# Patient Record
Sex: Male | Born: 1980 | Hispanic: Yes | Marital: Single | Smoking: Former smoker
Health system: Southern US, Community
[De-identification: ages and names within clinical notes are randomized; demographics above are authoritative.]

---

## 2012-01-31 ENCOUNTER — Ambulatory Visit (INDEPENDENT_AMBULATORY_CARE_PROVIDER_SITE_OTHER): Payer: 59 | Admitting: Emergency Medicine

## 2012-01-31 VITALS — BP 120/70 | HR 73 | Temp 98.2°F | Resp 16 | Ht 71.5 in | Wt 183.8 lb

## 2012-01-31 DIAGNOSIS — R002 Palpitations: Secondary | ICD-10-CM

## 2012-01-31 NOTE — Progress Notes (Signed)
  Subjective:    Patient ID: Casey Jacobs, male    DOB: 1980-11-20, 31 y.o.   MRN: [id number]  HPI Comments: Drank a number of red bulls last night and during the night and earlier today had episodes of tachycardia and shortness of breath never had this occur previously.  Palpitations  This is a new problem. The current episode started today. The problem occurs intermittently. The problem has been gradually improving. The symptoms are aggravated by caffeine. Associated symptoms include an irregular heartbeat and shortness of breath. Pertinent negatives include no anxiety, chest fullness, chest pain, coughing, diaphoresis, dizziness, fever, malaise/fatigue, nausea, near-syncope, numbness, syncope, vomiting or weakness. He has tried nothing for the symptoms. Risk factors include being male, smoking/tobacco exposure and stress. His past medical history is significant for anxiety. There is no history of anemia, drug use, heart disease, hyperthyroidism or a valve disorder.      Review of Systems  Constitutional: Negative.  Negative for fever, malaise/fatigue and diaphoresis.  HENT: Negative.   Eyes: Negative.   Respiratory: Positive for shortness of breath. Negative for cough.   Cardiovascular: Positive for palpitations. Negative for chest pain, syncope and near-syncope.  Gastrointestinal: Negative.  Negative for nausea and vomiting.  Genitourinary: Negative.   Musculoskeletal: Negative.   Neurological: Negative for dizziness, weakness and numbness.       Objective:   Physical Exam  Constitutional: He is oriented to person, place, and time. He appears well-developed and well-nourished.  HENT:  Head: Normocephalic and atraumatic.  Eyes: Conjunctivae are normal. Pupils are equal, round, and reactive to light.  Neck: Normal range of motion. No thyromegaly present.  Cardiovascular: Normal rate, regular rhythm and normal heart sounds.   Pulmonary/Chest: Effort normal and breath sounds normal.    Abdominal: Soft.  Musculoskeletal: Normal range of motion.  Neurological: He is alert and oriented to person, place, and time.  Skin: Skin is warm and dry.          Assessment & Plan:  Caffeine overdose Palpitations EKG significant for >1 mm ST elevation lead III To ER for evaluation.  Refused EMS and interventions will seek own transport.

## 2012-02-02 ENCOUNTER — Telehealth: Payer: Self-pay

## 2012-02-02 NOTE — Telephone Encounter (Signed)
Ok to write note for 02/01/12.

## 2012-02-02 NOTE — Telephone Encounter (Signed)
Is patient ok for note?  He was advised to go to ER and did not.Marland KitchenMarland Kitchen

## 2012-02-02 NOTE — Telephone Encounter (Signed)
Pt states he was seen on Sunday, did not report to work on Monday, would like an out of work note emailed to him at [email or web address]  cbbn [phone]

## 2012-02-02 NOTE — Telephone Encounter (Signed)
I have called patient to advise it will be placed at front desk and he can pick up.

## 2012-02-04 ENCOUNTER — Emergency Department (HOSPITAL_COMMUNITY)
Admission: EM | Admit: 2012-02-04 | Discharge: 2012-02-04 | Disposition: A | Discharge status: Home / Self Care | Payer: 59 | Source: Home / Self Care | Attending: Emergency Medicine | Admitting: Emergency Medicine

## 2012-02-04 ENCOUNTER — Encounter (HOSPITAL_COMMUNITY): Payer: Self-pay | Admitting: *Deleted

## 2012-02-04 DIAGNOSIS — R002 Palpitations: Secondary | ICD-10-CM

## 2012-02-04 NOTE — ED Notes (Signed)
Pt  Reports  About  4  Days  ahgo  He  Missed  redbulls  And  Vodka  And    Developed  Symptoms  Of    Palpations   intermittantly   -  He  Was  Seen  Monday  At  [facility]  And  Had  ekg  Done  And  Was  Told  Nothing   Found  However  He  Was to  followup  If  Symptoms  Continued   At this  Time  He  Is  Awake  Alert     Somewhat  Anxious  However   denys  Any  Pain

## 2012-02-04 NOTE — ED Provider Notes (Signed)
History     CSN: [id number]  Arrival date & time 02/04/12  1716   First MD Initiated Contact with Patient 02/04/12 1718      Chief Complaint  Patient presents with  . Tachycardia    (Consider location/radiation/quality/duration/timing/severity/associated sxs/prior treatment) HPI Comments: Patient presents to urgent care this evening as around noon today at resthe is experienced a no other episode of palpitations. He described that he had to take a couple of deep breaths denies any chest pains, dizziness, and they seem to have faded away in a couple minutes. He describes that since Saturday when he first makes some Redbull and Vodka, he says met suspects that that might have triggered this in terms and palpitation episodes. He went to [place] urgent care Monday where he was evaluated and had a normal EKG as well.PATIENT ALSO REPORTS THAT HE HAS MADE HIMSELF AN APPOINTMENT TO SEE A CARDIOLOGIST MONDAY he does not recall the name.  DURING INTERVIEW AND EXAM PATIENT WAS ASYMPTOMATIC DENYING ANY CHEST PAINS, SHORTNESS OF BREATH OR FEELING DIZZY OR ANY ACTIVE PALPITATIONS.  The history is provided by the patient.    History reviewed. No pertinent past medical history.  History reviewed. No pertinent past surgical history.  No family history on file.  History  Substance Use Topics  . Smoking status: Current Some Day Smoker    Types: Cigarettes  . Smokeless tobacco: Not on file   Comment: occaisional  . Alcohol Use: Yes      Review of Systems  Constitutional: Negative for diaphoresis, activity change, appetite change and fatigue.  HENT: Negative for neck pain.   Respiratory: Negative for cough.   Cardiovascular: Positive for palpitations. Negative for chest pain and leg swelling.  Neurological: Negative for dizziness, syncope, weakness, light-headedness and headaches.    Allergies  Review of patient's allergies indicates no known allergies.  Home Medications  No current  outpatient prescriptions on file.  BP 106/70  Pulse 84  Temp 98.2 F (36.8 C) (Oral)  Resp 18  SpO2 100%  Physical Exam  Nursing note and vitals reviewed. Constitutional: He appears well-developed and well-nourished.  Cardiovascular: Normal rate, regular rhythm, normal heart sounds and normal pulses.  Exam reveals no gallop and no friction rub.   No murmur heard.      EKG with normal sinus rhythm ventricular rate of 69 per minute. No ST or at T. Changes.  Pulmonary/Chest: Breath sounds normal.    ED Course  Procedures (including critical care time)  Labs Reviewed - No data to display No results found.   1. Palpitations       MDM  Recurrent palpitations, at urgent care this evening patient had a normal cardiovascular exam with a normal sinus rhythm and ventricular rate of 69. Subsequently to that to 2 rhythm strips of approximately 60 seconds with no abnormalities. Patient was asymptomatic during exam.        Jimmie Molly, MD 02/04/12 [phone]

## 2014-03-16 ENCOUNTER — Emergency Department (HOSPITAL_COMMUNITY)
Admission: EM | Admit: 2014-03-16 | Discharge: 2014-03-16 | Disposition: A | Discharge status: Home / Self Care | Payer: BC Managed Care – PPO | Attending: Emergency Medicine | Admitting: Emergency Medicine

## 2014-03-16 ENCOUNTER — Encounter (HOSPITAL_COMMUNITY): Payer: Self-pay | Admitting: Emergency Medicine

## 2014-03-16 DIAGNOSIS — Y92410 Unspecified street and highway as the place of occurrence of the external cause: Secondary | ICD-10-CM | POA: Insufficient documentation

## 2014-03-16 DIAGNOSIS — S199XXA Unspecified injury of neck, initial encounter: Secondary | ICD-10-CM | POA: Insufficient documentation

## 2014-03-16 DIAGNOSIS — S0990XA Unspecified injury of head, initial encounter: Secondary | ICD-10-CM | POA: Diagnosis present

## 2014-03-16 DIAGNOSIS — Z720 Tobacco use: Secondary | ICD-10-CM | POA: Insufficient documentation

## 2014-03-16 DIAGNOSIS — Y9389 Activity, other specified: Secondary | ICD-10-CM | POA: Diagnosis not present

## 2014-03-16 MED ORDER — NAPROXEN 500 MG PO TABS: 500.0000 mg | ORAL_TABLET | Freq: Two times a day (BID) | ORAL | Status: DC
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
## 2014-03-17 NOTE — ED Provider Notes (Signed)
Medical screening examination/treatment/procedure(s) were performed by non-physician practitioner and as supervising physician I was immediately available for consultation/collaboration.    Peggy Monk, MD 03/17/14 1820 

## 2014-07-20 ENCOUNTER — Emergency Department (HOSPITAL_COMMUNITY): Payer: BLUE CROSS/BLUE SHIELD

## 2014-07-20 ENCOUNTER — Encounter (HOSPITAL_COMMUNITY): Payer: Self-pay | Admitting: Emergency Medicine

## 2014-07-20 ENCOUNTER — Emergency Department (HOSPITAL_COMMUNITY)
Admission: EM | Admit: 2014-07-20 | Discharge: 2014-07-20 | Disposition: A | Discharge status: Home / Self Care | Payer: BLUE CROSS/BLUE SHIELD | Attending: Emergency Medicine | Admitting: Emergency Medicine

## 2014-07-20 DIAGNOSIS — Z87891 Personal history of nicotine dependence: Secondary | ICD-10-CM | POA: Diagnosis not present

## 2014-07-20 DIAGNOSIS — S161XXA Strain of muscle, fascia and tendon at neck level, initial encounter: Secondary | ICD-10-CM | POA: Diagnosis not present

## 2014-07-20 DIAGNOSIS — Y92410 Unspecified street and highway as the place of occurrence of the external cause: Secondary | ICD-10-CM | POA: Diagnosis not present

## 2014-07-20 DIAGNOSIS — S39012A Strain of muscle, fascia and tendon of lower back, initial encounter: Secondary | ICD-10-CM | POA: Diagnosis not present

## 2014-07-20 DIAGNOSIS — Y998 Other external cause status: Secondary | ICD-10-CM | POA: Insufficient documentation

## 2014-07-20 DIAGNOSIS — S199XXA Unspecified injury of neck, initial encounter: Secondary | ICD-10-CM | POA: Diagnosis present

## 2014-07-20 DIAGNOSIS — Y9389 Activity, other specified: Secondary | ICD-10-CM | POA: Diagnosis not present

## 2014-07-20 MED ORDER — NAPROXEN 500 MG PO TABS
500.0000 mg | ORAL_TABLET | Freq: Once | ORAL | Status: AC
  Administered 2014-07-20: 500 mg via ORAL
  Filled 2014-07-20: qty 1

## 2014-07-20 MED ORDER — TRAMADOL HCL 50 MG PO TABS: 50.0000 mg | ORAL_TABLET | Freq: Four times a day (QID) | ORAL | Status: AC | PRN

## 2014-07-20 MED ORDER — METHOCARBAMOL 500 MG PO TABS: 500.0000 mg | ORAL_TABLET | Freq: Two times a day (BID) | ORAL | Status: AC

## 2014-07-20 MED ORDER — NAPROXEN 500 MG PO TABS: 500.0000 mg | ORAL_TABLET | Freq: Two times a day (BID) | ORAL | Status: AC
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
## 2014-07-20 NOTE — ED Notes (Signed)
Patient transported to X-ray 
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
